# Patient Record
Sex: Female | Born: 1985 | Race: White | Hispanic: No | Marital: Married | Smoking: Current every day smoker
Health system: Southern US, Community
[De-identification: ages and names within clinical notes are randomized; demographics above are authoritative.]

---
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
## 2006-04-21 ENCOUNTER — Emergency Department: Payer: Self-pay | Admitting: Emergency Medicine

## 2006-08-30 ENCOUNTER — Other Ambulatory Visit: Admission: RE | Admit: 2006-08-30 | Discharge: 2006-08-30 | Payer: Self-pay | Admitting: Obstetrics and Gynecology

## 2006-10-13 ENCOUNTER — Ambulatory Visit (HOSPITAL_COMMUNITY): Admission: RE | Admit: 2006-10-13 | Discharge: 2006-10-13 | Payer: Self-pay | Admitting: Obstetrics and Gynecology

## 2007-04-20 ENCOUNTER — Emergency Department (HOSPITAL_COMMUNITY): Admission: EM | Admit: 2007-04-20 | Discharge: 2007-04-21 | Payer: Self-pay | Admitting: Emergency Medicine

## 2008-05-29 ENCOUNTER — Emergency Department (HOSPITAL_COMMUNITY): Admission: EM | Admit: 2008-05-29 | Discharge: 2008-05-29 | Payer: Self-pay | Admitting: Emergency Medicine

## 2008-07-18 ENCOUNTER — Emergency Department (HOSPITAL_COMMUNITY): Admission: EM | Admit: 2008-07-18 | Discharge: 2008-07-18 | Payer: Self-pay | Admitting: Emergency Medicine

## 2010-06-15 NOTE — H&P (Signed)
Nancy Potter, Nancy Potter                  ACCOUNT NO.:  [id number]   MEDICAL RECORD NO.:  [id number]          PATIENT TYPE:  AMB   LOCATION:  SDC                           FACILITY:  [facility]   PHYSICIAN:  James A. Ashley Royalty, M.D.DATE OF BIRTH:  26-Mar-1985   DATE OF ADMISSION:  10/13/2006  DATE OF DISCHARGE:                              HISTORY & PHYSICAL   This is a 25 year old nulligravida who presented for GYN care August 30, 2006.  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  Options were  discussed including expectant management versus laser vaporization, and  the patient elected latter.   MEDICATIONS:  None.   PAST MEDICAL HISTORY:   MEDICAL:  Negative.   SURGICAL:  Knee surgery.   ALLERGIES:  AMOXICILLIN.   FAMILY HISTORY:  Positive for hypertension and diabetes.   SOCIAL HISTORY:  The patient smokes approximately 1 pack of cigarettes  per day.  She denies significant use of alcohol.   REVIEW OF SYSTEMS:  Noncontributory.   PHYSICAL EXAMINATION:  Well-developed, well-nourished pleasant female in  no acute distress.  Afebrile with vital signs stable.  CHEST:  Lungs are clear.  CARDIAC:  Regular rate and rhythm.  ABDOMEN:  Soft.  PELVIC EXAMINATION:  Please see most recent office evaluation.  EXTERNAL GENITALIA:  External genitalia is within normal limits.  Vagina  is without gross lesions.  Examination of the cervix reveals acetone  negative lesion consistent with the large geographic area of CIN 1.  Bimanual examination reveals the uterus to be approximately 8 x 4 x 4 cm  and no adnexal masses are palpable.   IMPRESSION:  1. 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  2. Smoker.   PLAN:  Laser vaporization of the cervix.  Risks, benefits,  complications, and alternatives  were discussed with the patient.  She  states she understands and accepts.  Questions invited and answered.      James A. Ashley Royalty, M.D.  Electronically Signed     JAM/MEDQ  D:  10/13/2006  T:  10/13/2006  Job:  [id number]

## 2010-06-15 NOTE — Op Note (Signed)
[name], [name]                  ACCOUNT NO.:  [id number]   MEDICAL RECORD NO.:  [id number]          PATIENT TYPE:  AMB   LOCATION:  SDC                           FACILITY:  [facility]   PHYSICIAN:  James A. Ashley Royalty, M.D.DATE OF BIRTH:  [birth year]   DATE OF PROCEDURE:  10/13/2006  DATE OF DISCHARGE:                               OPERATIVE REPORT   PREOPERATIVE DIAGNOSIS:  Cervical intraepithelial neoplasia, grade 1,  with large geographic acetonegative lesion of the cervix.   POSTOPERATIVE DIAGNOSIS:  Cervical intraepithelial neoplasia, grade 1,  with large geographic acetonegative lesion of the cervix.   PROCEDURE:  Laser vaporization of the cervix.   SURGEON:  Rudy Jew. Ashley Royalty, MD   ANESTHESIA:  General, 1% Xylocaine with 1:100,000 epinephrine local in  the cervix (18 mL).   ESTIMATED BLOOD LOSS:  50 mL.   COMPLICATIONS:  None.   PACKS AND DRAINS:  None.   PROCEDURE:  The patient was taken to the operating room and placed in  the dorsal supine position.  After a general anesthetic was  administered, she was placed in the lithotomy position and a galvanized  speculum was placed per vagina.  The gross cervical lesion was  visualized and strategy planned.  The cervix was gently cleansed with  Hibiclens-type solution.  The anterior lip of the cervix was grasped  with a galvanized tenaculum.  Using the laser at 20 watts power and  small spot size, the entire lesion was circumscribed.  Next the  Xylocaine local anesthetic was administered in order to aid in  hemostasis.  Next the circumscribed area was divided into four  quadrants.  The posterior quadrants were vaporized first.  Each quadrant  was vaporized down to a level of 5-7 mm below the surrounding tissue.  At the conclusion of vaporization of each quadrant, the depth was  verified.  After the entire lesion had been vaporized, the defocus beam  was employed in order to aid in hemostasis.  Hemostasis was noted.  The  entire  bed was then bathed with Monsel solution.  The tenaculum was  removed, hemostasis noted and the procedure terminated.   The patient tolerated the procedure extremely well and was returned to  the recovery room in good condition.      James A. Ashley Royalty, M.D.  Electronically Signed    JAM/MEDQ  D:  10/13/2006  T:  10/14/2006  Job:  [id number]

## 2010-10-25 LAB — DIFFERENTIAL
Basophils Absolute: 0.1
Basophils Relative: 1
Eosinophils Absolute: 0.2
Eosinophils Relative: 2
Lymphocytes Relative: 20
Lymphs Abs: 2.2
Monocytes Absolute: 1
Monocytes Relative: 10
Neutro Abs: 7.5
Neutrophils Relative %: 68

## 2010-10-25 LAB — CBC
HCT: 42.2
Hemoglobin: 14.4
MCHC: 34.1
MCV: 85.8
Platelets: 283
RBC: 4.92
RDW: 12.8
WBC: 11 — ABNORMAL HIGH

## 2010-10-25 LAB — I-STAT 8, (EC8 V) (CONVERTED LAB)
Acid-Base Excess: 3 — ABNORMAL HIGH
BUN: 10
Chloride: 104
HCT: 45
Hemoglobin: 15.3 — ABNORMAL HIGH
Operator id: 294511
Sodium: 138
pCO2, Ven: 50.9 — ABNORMAL HIGH

## 2010-10-25 LAB — POCT I-STAT CREATININE: Creatinine, Ser: 0.9

## 2010-11-12 LAB — TYPE AND SCREEN
ABO/RH(D): A POS
Antibody Screen: NEGATIVE

## 2010-11-12 LAB — CBC
MCHC: 35.5
MCV: 86.1
Platelets: 260
RBC: 4.44
WBC: 10.2

## 2010-11-12 LAB — ABO/RH: ABO/RH(D): A POS

## 2013-02-06 ENCOUNTER — Other Ambulatory Visit: Payer: Self-pay | Admitting: Family Medicine

## 2013-02-06 DIAGNOSIS — R519 Headache, unspecified: Secondary | ICD-10-CM

## 2013-02-06 DIAGNOSIS — H532 Diplopia: Secondary | ICD-10-CM

## 2013-02-06 DIAGNOSIS — R51 Headache: Secondary | ICD-10-CM

## 2013-02-07 ENCOUNTER — Ambulatory Visit
Admission: RE | Admit: 2013-02-07 | Discharge: 2013-02-07 | Disposition: A | Discharge status: Home / Self Care | Payer: BC Managed Care – PPO | Source: Ambulatory Visit | Attending: Family Medicine | Admitting: Family Medicine

## 2013-02-07 DIAGNOSIS — H532 Diplopia: Secondary | ICD-10-CM

## 2013-02-07 DIAGNOSIS — R51 Headache: Secondary | ICD-10-CM

## 2013-02-07 DIAGNOSIS — R519 Headache, unspecified: Secondary | ICD-10-CM

## 2013-02-07 MED ORDER — GADOBENATE DIMEGLUMINE 529 MG/ML IV SOLN
20.0000 mL | Freq: Once | INTRAVENOUS | Status: AC | PRN
  Administered 2013-02-07: 20 mL via INTRAVENOUS

## 2014-12-05 ENCOUNTER — Ambulatory Visit: Payer: Self-pay

## 2014-12-05 ENCOUNTER — Other Ambulatory Visit: Payer: Self-pay | Admitting: Occupational Medicine

## 2014-12-05 DIAGNOSIS — M25571 Pain in right ankle and joints of right foot: Secondary | ICD-10-CM

## 2014-12-15 ENCOUNTER — Ambulatory Visit
Admission: RE | Admit: 2014-12-15 | Discharge: 2014-12-15 | Disposition: A | Discharge status: Home / Self Care | Payer: Worker's Compensation | Source: Ambulatory Visit | Attending: Occupational Medicine | Admitting: Occupational Medicine

## 2014-12-15 ENCOUNTER — Other Ambulatory Visit: Payer: Self-pay | Admitting: Occupational Medicine

## 2014-12-15 DIAGNOSIS — T1490XA Injury, unspecified, initial encounter: Secondary | ICD-10-CM

## 2018-11-09 ENCOUNTER — Ambulatory Visit (HOSPITAL_COMMUNITY)
Admission: EM | Admit: 2018-11-09 | Discharge: 2018-11-09 | Disposition: A | Discharge status: Home / Self Care | Payer: Worker's Compensation | Attending: Family Medicine | Admitting: Family Medicine

## 2018-11-09 ENCOUNTER — Other Ambulatory Visit: Payer: Self-pay

## 2018-11-09 ENCOUNTER — Encounter (HOSPITAL_COMMUNITY): Payer: Self-pay

## 2018-11-09 ENCOUNTER — Ambulatory Visit (INDEPENDENT_AMBULATORY_CARE_PROVIDER_SITE_OTHER): Payer: Worker's Compensation

## 2018-11-09 DIAGNOSIS — Z23 Encounter for immunization: Secondary | ICD-10-CM

## 2018-11-09 DIAGNOSIS — W19XXXA Unspecified fall, initial encounter: Secondary | ICD-10-CM

## 2018-11-09 DIAGNOSIS — T148XXA Other injury of unspecified body region, initial encounter: Secondary | ICD-10-CM

## 2018-11-09 DIAGNOSIS — IMO0001 Reserved for inherently not codable concepts without codable children: Secondary | ICD-10-CM

## 2018-11-09 DIAGNOSIS — S50811A Abrasion of right forearm, initial encounter: Secondary | ICD-10-CM | POA: Diagnosis not present

## 2018-11-09 DIAGNOSIS — S93402A Sprain of unspecified ligament of left ankle, initial encounter: Secondary | ICD-10-CM

## 2018-11-09 MED ORDER — TETANUS-DIPHTH-ACELL PERTUSSIS 5-2.5-18.5 LF-MCG/0.5 IM SUSP
INTRAMUSCULAR | Status: AC
  Filled 2018-11-09: qty 0.5

## 2018-11-09 MED ORDER — TETANUS-DIPHTH-ACELL PERTUSSIS 5-2.5-18.5 LF-MCG/0.5 IM SUSP
0.5000 mL | Freq: Once | INTRAMUSCULAR | Status: AC
  Administered 2018-11-09: 16:00:00 0.5 mL via INTRAMUSCULAR

## 2018-11-09 MED ORDER — HYDROCODONE-ACETAMINOPHEN 5-325 MG PO TABS: 1.0000 | ORAL_TABLET | Freq: Four times a day (QID) | ORAL | 0 refills | Status: AC | PRN

## 2018-11-09 NOTE — Discharge Instructions (Addendum)
Follow-up with workers comp clinic on Monday morning

## 2018-11-09 NOTE — ED Triage Notes (Signed)
Pt. States while walking her ankle snapped and she fell and scrapped her right forearm. She is currently in a wheelchair because of injury.

## 2018-11-09 NOTE — ED Provider Notes (Signed)
[facility]    CSN: [id number] Arrival date & time: 11/09/18  1502      History   Chief Complaint Chief Complaint  Patient presents with  . Fall    HPI Nancy Potter is a 33 y.o. female.   33 year old woman presents for evaluation of fall with left ankle pain and abrasion on right fore arm.  Patient was working on road work and left ankle gave out and she heard a snap.  She scraped her right proximal forearm but does not have any trouble moving her arm, elbow, wrist, or fingers.  She is uncertain of her last tetanus shot.  This appears to be a workers comp injury.     History reviewed. No pertinent past medical history.  There are no active problems to display for this patient.   History reviewed. No pertinent surgical history.  OB History   No obstetric history on file.      Home Medications    Prior to Admission medications   Medication Sig Start Date End Date Taking? Authorizing Provider  HYDROcodone-acetaminophen (NORCO) 5-325 MG tablet Take 1 tablet by mouth every 6 (six) hours as needed for moderate pain. 11/09/18   Elvina Sidle, MD  WYMZYA FE 0.4-35 MG-MCG tablet 1 (ONE) TABLET CHEWABLE DAILY FOR 9 WEEKS (SKIP PLACEBO TABLETS) THEN OFF FOR 1 WEEK & REPEAT 09/25/18   [provider]    Family History Family History  Problem Relation Age of Onset  . Diabetes Mother   . Hypertension Father     Social History Social History   Tobacco Use  . Smoking status: Current Every Day Smoker    Packs/day: 1.00    Years: 8.00    Pack years: 8.00    Types: Cigarettes  . Smokeless tobacco: Never Used  Substance Use Topics  . Alcohol use: Never    Frequency: Never  . Drug use: Yes    Types: Marijuana     Allergies   Penicillin g and Sulfa antibiotics   Review of Systems Review of Systems  Musculoskeletal: Positive for gait problem.  Skin: Positive for wound.  All other systems reviewed and are negative.    Physical Exam  Triage Vital Signs ED Triage Vitals  Enc Vitals Group     BP 11/09/18 1534 140/75     Pulse Rate 11/09/18 1534 80     Resp --      Temp 11/09/18 1534 98.4 F (36.9 C)     Temp Source 11/09/18 1534 Oral     SpO2 11/09/18 1534 99 %     Weight --      Height --      Head Circumference --      Peak Flow --      Pain Score 11/09/18 1529 9     Pain Loc --      Pain Edu? --      Excl. in GC? --    No data found.  Updated Vital Signs BP 140/75 (BP Location: Left Arm)   Pulse 80   Temp 98.4 F (36.9 C) (Oral)   SpO2 99%    Physical Exam Vitals signs and nursing note reviewed.  Constitutional:      Appearance: Normal appearance. She is obese.  Eyes:     Conjunctiva/sclera: Conjunctivae normal.  Neck:     Musculoskeletal: Normal range of motion and neck supple.  Cardiovascular:     Rate and Rhythm: Normal rate.  Pulmonary:  Effort: Pulmonary effort is normal.  Musculoskeletal:     Comments: Swelling and tenderness over left lateral malleolus.  Palpation of the foot is otherwise normal.    Skin:    General: Skin is warm.     Comments: Superficial abrasion proximal ulna of right arm  Neurological:     General: No focal deficit present.     Mental Status: She is alert.  Psychiatric:        Mood and Affect: Mood normal.        Behavior: Behavior normal.      UC Treatments / Results  Labs   Radiology No results found.  Procedures Procedures (including critical care time)  Medications Ordered in UC Medications  Tdap (BOOSTRIX) injection 0.5 mL (0.5 mLs Intramuscular Given 11/09/18 1555)  Tdap (BOOSTRIX) 5-2.5-18.5 LF-MCG/0.5 injection (has no administration in time range)    Initial Impression / Assessment and Plan / UC Course  I have reviewed the triage vital signs and the nursing notes.  Pertinent labs & imaging results that were available during my care of the patient were reviewed by me and considered in my medical decision making (see chart for  details).    Final Clinical Impressions(s) / UC Diagnoses   Final diagnoses:  Grade 2 ankle sprain, left, initial encounter  Abrasion     Discharge Instructions     Follow-up with workers comp clinic on Monday morning    ED Prescriptions    Medication Sig Dispense Auth. Provider   HYDROcodone-acetaminophen (NORCO) 5-325 MG tablet Take 1 tablet by mouth every 6 (six) hours as needed for moderate pain. 12 tablet Robyn Haber, MD     I have reviewed the PDMP during this encounter.   Robyn Haber, MD 11/09/18 1624

## 2020-06-09 IMAGING — DX DG ANKLE COMPLETE 3+V*L*
3 series · 3 of 3 positions shown · non-contrast
Comparison: 07/18/2008

CLINICAL DATA: Fall.  Ankle pain

EXAM:
LEFT ANKLE COMPLETE - 3+ VIEW

[ankle ap]
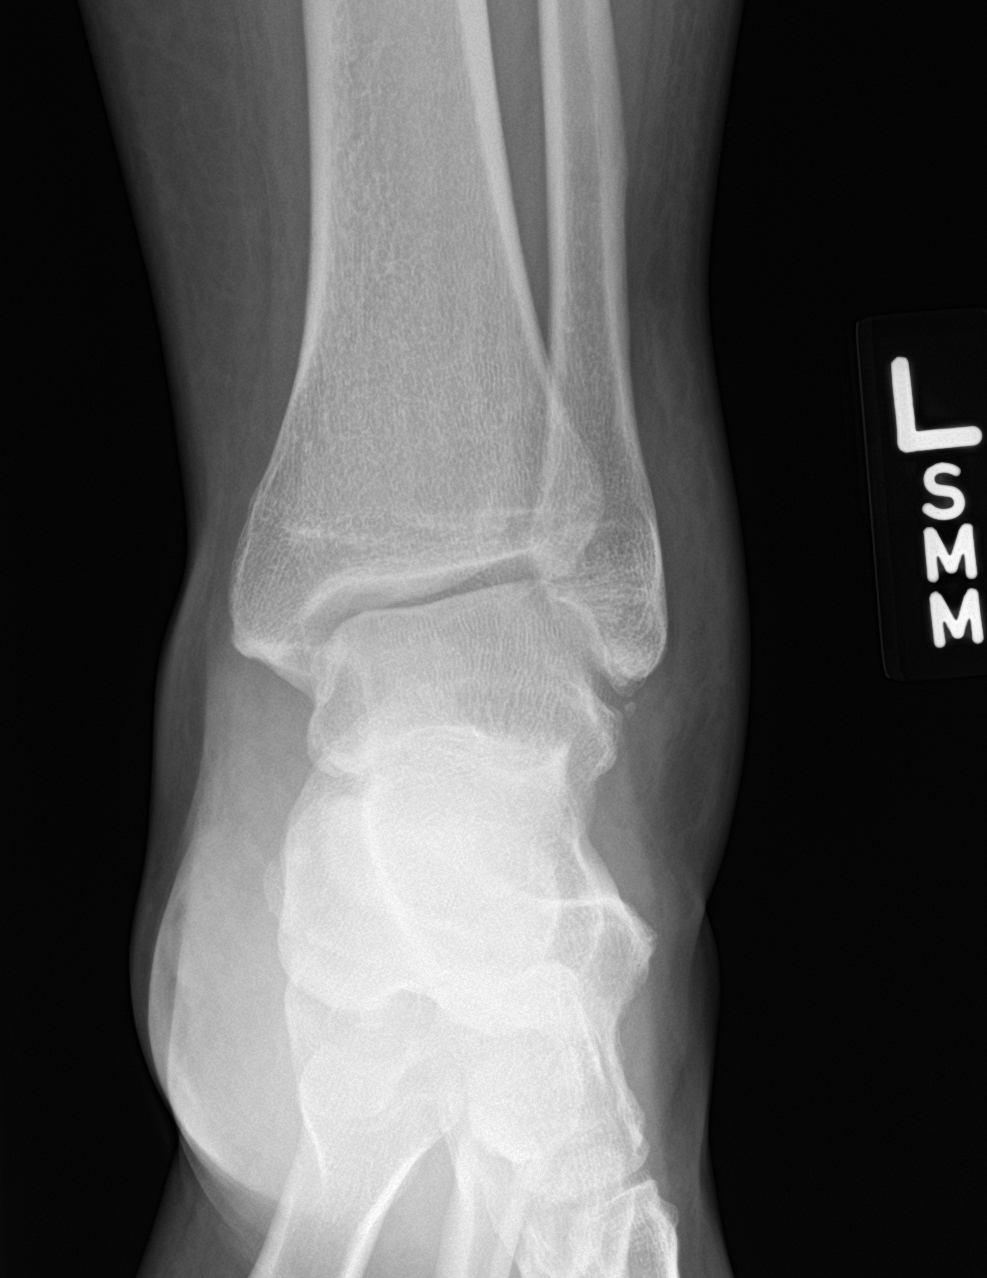

[ankle obl]
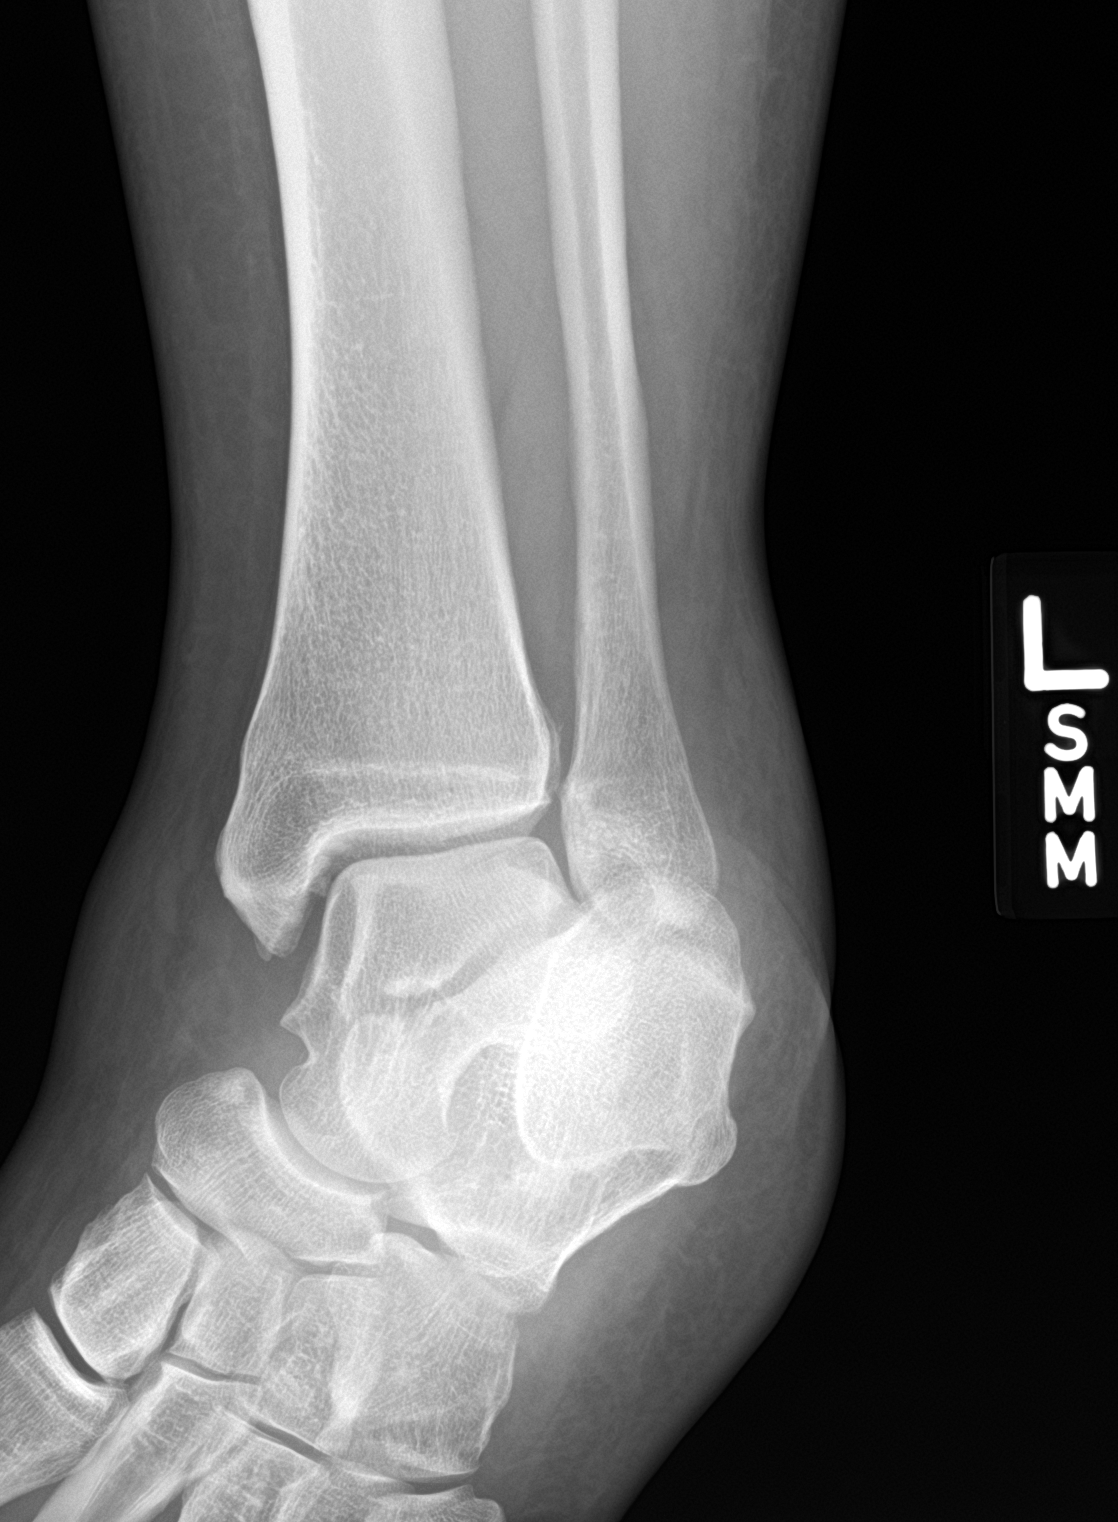

[ankle lat]
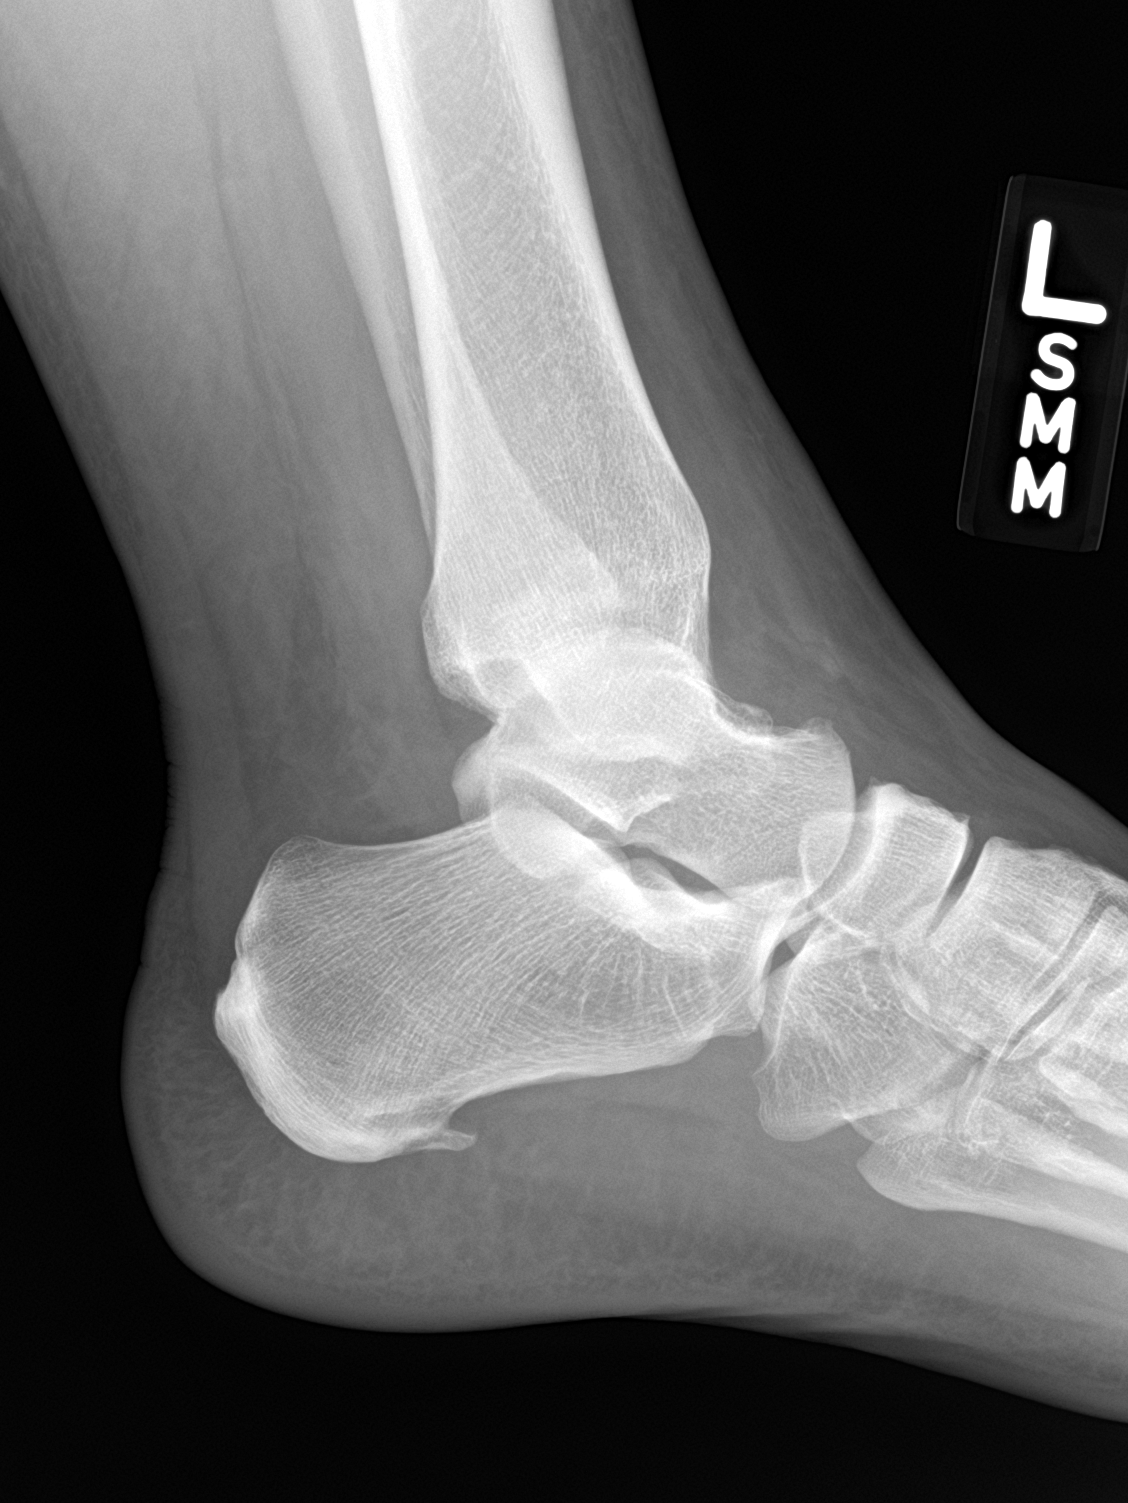

[3 of 3 positions shown; findings below may reference images not displayed]

FINDINGS: Negative for acute fracture. Tiny well corticated ossicle distal to
the distal fibula not seen previously and probably due to old
injury. Normal ankle joint. Diffuse soft tissue swelling. Calcaneal
spurring.
IMPRESSION: Negative for acute fracture
